# Patient Record
Sex: Female | Born: 1960 | Race: White | Hispanic: No | Marital: Married | Smoking: Never smoker
Health system: Southern US, Community
[De-identification: ages and names within clinical notes are randomized; demographics above are authoritative.]

## PROBLEM LIST (undated history)

## (undated) DIAGNOSIS — E7800 Pure hypercholesterolemia, unspecified: Secondary | ICD-10-CM

## (undated) DIAGNOSIS — E119 Type 2 diabetes mellitus without complications: Secondary | ICD-10-CM

## (undated) DIAGNOSIS — I10 Essential (primary) hypertension: Secondary | ICD-10-CM

## (undated) HISTORY — PX: OTHER SURGICAL HISTORY: SHX169

---

## 1997-10-03 ENCOUNTER — Other Ambulatory Visit: Admission: RE | Admit: 1997-10-03 | Discharge: 1997-10-03 | Payer: Self-pay | Admitting: Obstetrics and Gynecology

## 1998-11-26 ENCOUNTER — Encounter (INDEPENDENT_AMBULATORY_CARE_PROVIDER_SITE_OTHER): Payer: Self-pay | Admitting: Specialist

## 1998-11-26 ENCOUNTER — Other Ambulatory Visit: Admission: RE | Admit: 1998-11-26 | Discharge: 1998-11-26 | Payer: Self-pay | Admitting: Obstetrics and Gynecology

## 2002-03-15 ENCOUNTER — Other Ambulatory Visit: Admission: RE | Admit: 2002-03-15 | Discharge: 2002-03-15 | Payer: Self-pay

## 2002-05-09 ENCOUNTER — Encounter: Admission: RE | Admit: 2002-05-09 | Discharge: 2002-05-09 | Payer: Self-pay

## 2003-04-17 ENCOUNTER — Other Ambulatory Visit: Admission: RE | Admit: 2003-04-17 | Discharge: 2003-04-17 | Payer: Self-pay | Admitting: Obstetrics and Gynecology

## 2003-05-24 ENCOUNTER — Encounter: Admission: RE | Admit: 2003-05-24 | Discharge: 2003-05-24 | Payer: Self-pay | Admitting: Obstetrics and Gynecology

## 2003-06-22 ENCOUNTER — Encounter: Admission: RE | Admit: 2003-06-22 | Discharge: 2003-09-20 | Payer: Self-pay | Admitting: Cardiology

## 2004-06-13 ENCOUNTER — Other Ambulatory Visit: Admission: RE | Admit: 2004-06-13 | Discharge: 2004-06-13 | Payer: Self-pay | Admitting: Obstetrics and Gynecology

## 2004-12-02 ENCOUNTER — Emergency Department (HOSPITAL_COMMUNITY): Admission: EM | Admit: 2004-12-02 | Discharge: 2004-12-02 | Payer: Self-pay | Admitting: Family Medicine

## 2005-07-03 ENCOUNTER — Emergency Department (HOSPITAL_COMMUNITY): Admission: EM | Admit: 2005-07-03 | Discharge: 2005-07-03 | Payer: Self-pay | Admitting: Family Medicine

## 2006-07-20 ENCOUNTER — Emergency Department (HOSPITAL_COMMUNITY): Admission: EM | Admit: 2006-07-20 | Discharge: 2006-07-20 | Payer: Self-pay | Admitting: Emergency Medicine

## 2007-01-17 IMAGING — CR DG ABDOMEN 1V
1 series · 1 of 1 positions shown · non-contrast
Comparison: None.

CLINICAL DATA: Stomach pain.  Diarrhea and nausea.  
 ABDOMEN ? SINGLE VIEW ? 07/03/05:

[view not recorded]
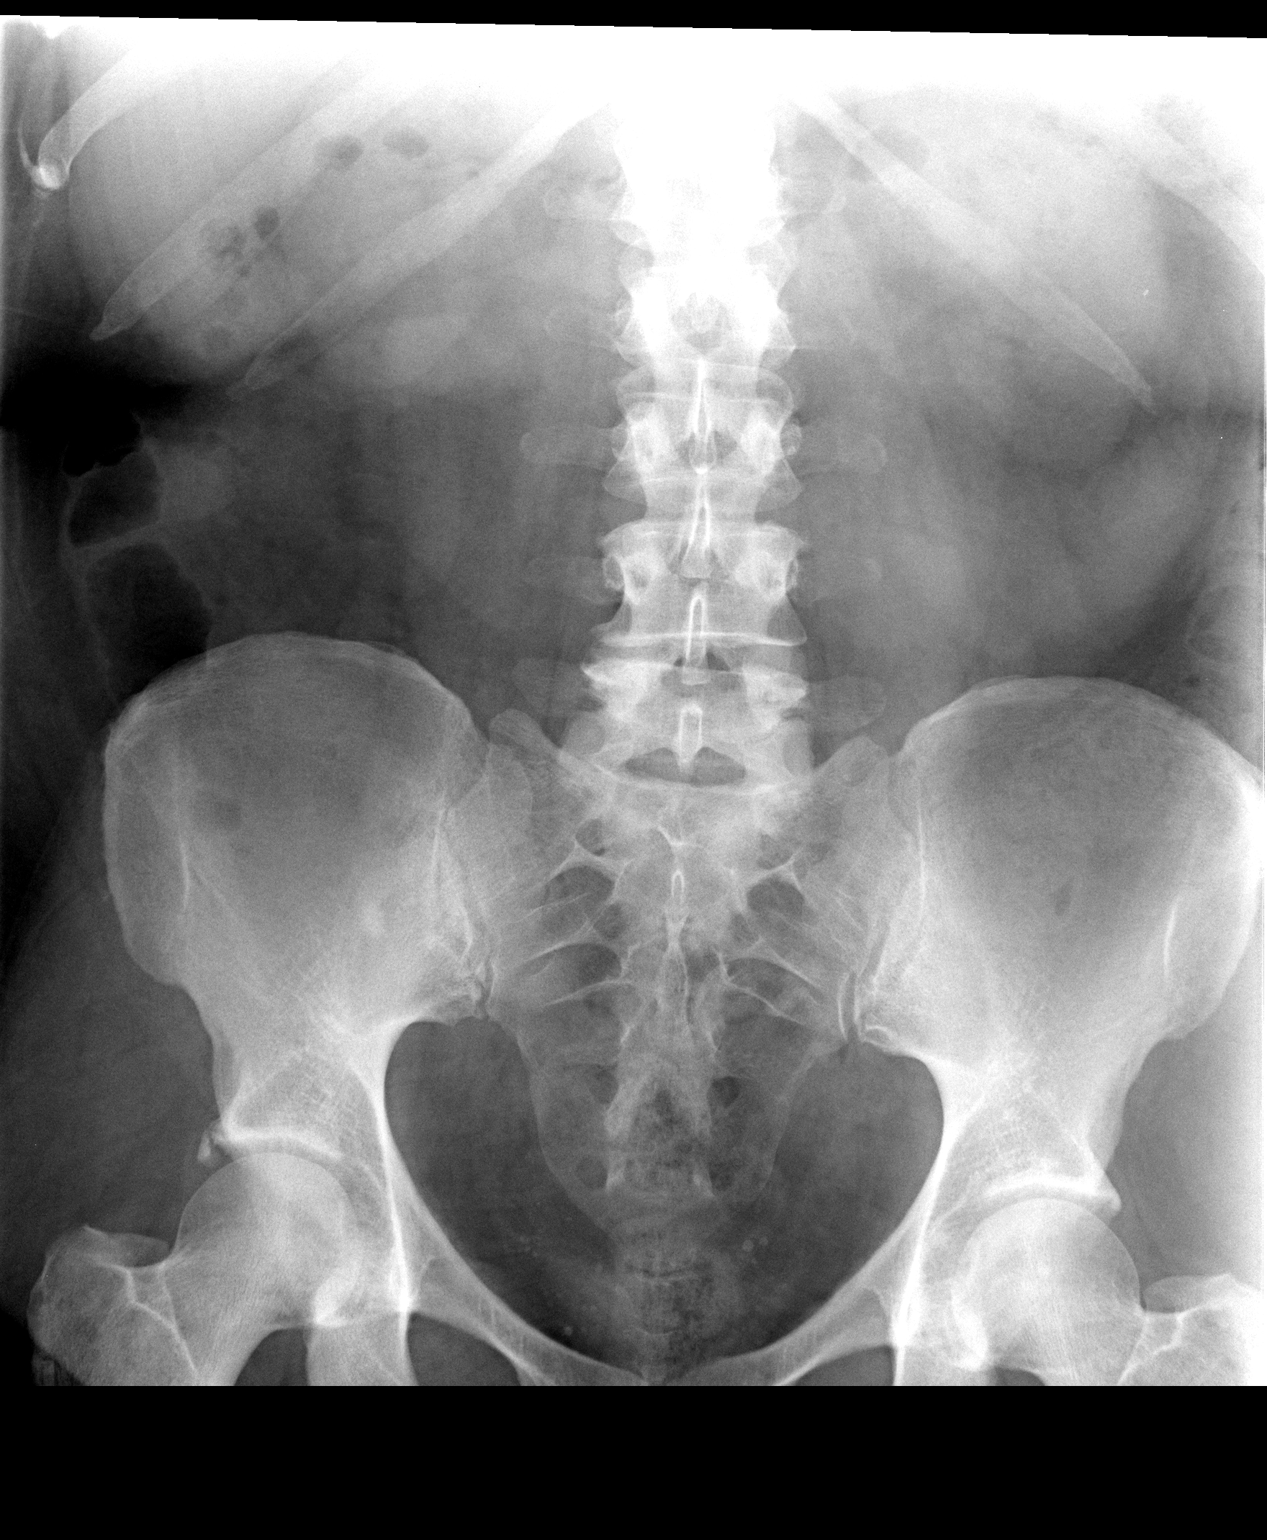

[1 of 1 positions shown; findings below may reference images not displayed]

FINDINGS: No calcific densities of the kidneys and expected course of the ureters.  Unremarkable bowel gas pattern without evidence of dilatation.  Probable phlebolith in the pelvis.  Degenerative changes involve bilateral hips.  Minimal convex left lumbar spinal curvature.
IMPRESSION: No acute process in the abdomen.

## 2010-03-30 ENCOUNTER — Encounter: Payer: Self-pay | Admitting: Obstetrics and Gynecology

## 2013-10-19 ENCOUNTER — Telehealth: Payer: Self-pay

## 2013-10-19 NOTE — Telephone Encounter (Signed)
Pt is out of town and family will let her know we called

## 2013-11-07 NOTE — Telephone Encounter (Signed)
I called pt. She travels quite a bit. She will call back when she is ready to schedule. She has never had a colonoscopy and is not having any problems and no family hx of colon cancer. Letter to PCP.

## 2015-02-06 ENCOUNTER — Encounter (INDEPENDENT_AMBULATORY_CARE_PROVIDER_SITE_OTHER): Payer: Self-pay | Admitting: *Deleted

## 2015-10-31 ENCOUNTER — Telehealth: Payer: Self-pay

## 2015-10-31 NOTE — Telephone Encounter (Signed)
Pt received a letter from DS to be triaged. Please call [phone]

## 2015-11-01 ENCOUNTER — Telehealth: Payer: Self-pay

## 2015-11-01 NOTE — Telephone Encounter (Signed)
See separate triage.  

## 2015-11-06 NOTE — Telephone Encounter (Signed)
Pt called and has rescheduled her appt to 11/29/2015 at 8:30 Am with Dr. Gala Romney.

## 2015-11-06 NOTE — Telephone Encounter (Signed)
Gastroenterology Pre-Procedure Review  Request Date: 11/01/2015 Requesting Physician: Dr. Gerarda Fraction  PATIENT REVIEW QUESTIONS: The patient responded to the following health history questions as indicated:    1. Diabetes Melitis:YES 2. Joint replacements in the past 12 months: no 3. Major health problems in the past 3 months: no 4. Has an artificial valve or MVP: no 5. Has a defibrillator: no 6. Has been advised in past to take antibiotics in advance of a procedure like teeth cleaning: no 7. Family history of colon cancer: no  8. Alcohol Use: wine maybe once or twice a week and occasionally a bourbon 9. History of sleep apnea: no     MEDICATIONS & ALLERGIES:    Patient reports the following regarding taking any blood thinners:   Plavix? no Aspirin? YES Coumadin? no  Patient confirms/reports the following medications:  Current Outpatient Prescriptions  Medication Sig Dispense Refill  . aspirin EC 81 MG tablet Take 81 mg by mouth daily. Takes one tablet every other day    . Linagliptin-Metformin HCl (JENTADUETO) 2.07-998 MG TABS Take by mouth 2 (two) times daily.    Marland Kitchen lisinopril (PRINIVIL,ZESTRIL) 10 MG tablet Take 10 mg by mouth daily.    . simvastatin (ZOCOR) 10 MG tablet Take 10 mg by mouth daily.     No current facility-administered medications for this visit.     Patient confirms/reports the following allergies:  No Known Allergies  No orders of the defined types were placed in this encounter.   AUTHORIZATION INFORMATION Primary Insurance:  ID #:   Group #:  Pre-Cert / Auth required:  Pre-Cert / Auth #:   Secondary Insurance:   ID #:   Group #:  Pre-Cert / Auth required:  Pre-Cert / Auth #:   SCHEDULE INFORMATION: Procedure has been scheduled as follows:  Date:  11/22/2015              Time: 9:00 AM Location: [facility] Short Stay  This Gastroenterology Pre-Precedure Review Form is being routed to the following provider(s): R. Garfield Cornea, MD

## 2015-11-07 NOTE — Telephone Encounter (Signed)
Phenergan 25 mg IV on call due to intermittent ETOH once or twice a week.  No diabetes medication the day of procedure.

## 2015-11-09 ENCOUNTER — Other Ambulatory Visit: Payer: Self-pay

## 2015-11-09 DIAGNOSIS — Z1211 Encounter for screening for malignant neoplasm of colon: Secondary | ICD-10-CM

## 2015-11-09 MED ORDER — NA SULFATE-K SULFATE-MG SULF 17.5-3.13-1.6 GM/177ML PO SOLN: 1.0000 | ORAL | 0 refills | Status: AC

## 2015-11-09 NOTE — Telephone Encounter (Signed)
Orders entered with Phenergan. Rx sent to the pharmacy and instructions mailed to pt.

## 2015-11-15 NOTE — Telephone Encounter (Signed)
Pt's procedure time was moved up to 9:45 AM.  I am mailing new instructions for her and she is aware.

## 2015-12-05 ENCOUNTER — Telehealth: Payer: Self-pay

## 2015-12-05 NOTE — Telephone Encounter (Signed)
PA # [id number] for the colonoscopy. Kaylee Hurst spoke to [place] at [phone].

## 2015-12-05 NOTE — Telephone Encounter (Signed)
I called pt to update meds prior to her colonoscopy tomorrow.  She said there has been no change in her meds since she was triaged.

## 2015-12-06 ENCOUNTER — Ambulatory Visit (HOSPITAL_COMMUNITY)
Admission: RE | Admit: 2015-12-06 | Discharge: 2015-12-06 | Disposition: A | Discharge status: Home / Self Care | Payer: 59 | Source: Ambulatory Visit | Attending: Internal Medicine | Admitting: Internal Medicine

## 2015-12-06 ENCOUNTER — Encounter (HOSPITAL_COMMUNITY): Payer: Self-pay | Admitting: *Deleted

## 2015-12-06 ENCOUNTER — Encounter (HOSPITAL_COMMUNITY)
Admission: RE | Disposition: A | Discharge status: Home / Self Care | Payer: Self-pay | Source: Ambulatory Visit | Attending: Internal Medicine

## 2015-12-06 DIAGNOSIS — E7800 Pure hypercholesterolemia, unspecified: Secondary | ICD-10-CM | POA: Diagnosis not present

## 2015-12-06 DIAGNOSIS — Z7982 Long term (current) use of aspirin: Secondary | ICD-10-CM | POA: Diagnosis not present

## 2015-12-06 DIAGNOSIS — Z1212 Encounter for screening for malignant neoplasm of rectum: Secondary | ICD-10-CM | POA: Diagnosis not present

## 2015-12-06 DIAGNOSIS — E119 Type 2 diabetes mellitus without complications: Secondary | ICD-10-CM | POA: Diagnosis not present

## 2015-12-06 DIAGNOSIS — I10 Essential (primary) hypertension: Secondary | ICD-10-CM | POA: Insufficient documentation

## 2015-12-06 DIAGNOSIS — Z1211 Encounter for screening for malignant neoplasm of colon: Secondary | ICD-10-CM | POA: Diagnosis present

## 2015-12-06 DIAGNOSIS — Z79899 Other long term (current) drug therapy: Secondary | ICD-10-CM | POA: Insufficient documentation

## 2015-12-06 DIAGNOSIS — Z85828 Personal history of other malignant neoplasm of skin: Secondary | ICD-10-CM | POA: Insufficient documentation

## 2015-12-06 HISTORY — DX: Type 2 diabetes mellitus without complications: E11.9

## 2015-12-06 HISTORY — DX: Pure hypercholesterolemia, unspecified: E78.00

## 2015-12-06 HISTORY — PX: COLONOSCOPY: SHX5424

## 2015-12-06 HISTORY — DX: Essential (primary) hypertension: I10

## 2015-12-06 LAB — GLUCOSE, CAPILLARY: Glucose-Capillary: 121 mg/dL — ABNORMAL HIGH (ref 65–99)

## 2015-12-06 SURGERY — COLONOSCOPY
Anesthesia: Moderate Sedation

## 2015-12-06 MED ORDER — MIDAZOLAM HCL 5 MG/5ML IJ SOLN
INTRAMUSCULAR | Status: DC
Start: 2015-12-06 — End: 2015-12-06
  Filled 2015-12-06: qty 10

## 2015-12-06 MED ORDER — MEPERIDINE HCL 100 MG/ML IJ SOLN
INTRAMUSCULAR | Status: DC
Start: 2015-12-06 — End: 2015-12-06
  Filled 2015-12-06: qty 2

## 2015-12-06 MED ORDER — STERILE WATER FOR IRRIGATION IR SOLN
Status: DC | PRN
  Administered 2015-12-06: 10:00:00

## 2015-12-06 MED ORDER — PROMETHAZINE HCL 25 MG/ML IJ SOLN: 25.0000 mg | Freq: Once | INTRAMUSCULAR | Status: DC

## 2015-12-06 MED ORDER — MEPERIDINE HCL 100 MG/ML IJ SOLN
INTRAMUSCULAR | Status: DC | PRN
  Administered 2015-12-06: 50 mg via INTRAVENOUS
  Administered 2015-12-06 (×2): 25 mg via INTRAVENOUS

## 2015-12-06 MED ORDER — SODIUM CHLORIDE 0.9 % IV SOLN
INTRAVENOUS | Status: DC
  Administered 2015-12-06: 10:00:00 via INTRAVENOUS

## 2015-12-06 MED ORDER — ONDANSETRON HCL 4 MG/2ML IJ SOLN
INTRAMUSCULAR | Status: DC
Start: 2015-12-06 — End: 2015-12-06
  Filled 2015-12-06: qty 2

## 2015-12-06 MED ORDER — ONDANSETRON HCL 4 MG/2ML IJ SOLN
INTRAMUSCULAR | Status: DC | PRN
  Administered 2015-12-06: 4 mg via INTRAVENOUS

## 2015-12-06 MED ORDER — MIDAZOLAM HCL 5 MG/5ML IJ SOLN
INTRAMUSCULAR | Status: DC | PRN
  Administered 2015-12-06: 2 mg via INTRAVENOUS
  Administered 2015-12-06: 1 mg via INTRAVENOUS
  Administered 2015-12-06: 2 mg via INTRAVENOUS

## 2015-12-06 NOTE — Discharge Instructions (Signed)

## 2015-12-06 NOTE — H&P (Signed)
$'@LOGO't$ @   Primary Care Physician:  Purvis Kilts, MD Primary Gastroenterologist:  Dr. Gala Romney  Pre-Procedure History & Physical: HPI:  Kaylee Hurst is a 55 y.o. female is here for a screening colonoscopy. No bowel symptoms. No family history colon cancer. No prior colonoscopy.  Past Medical History:  Diagnosis Date  . Diabetes mellitus without complication ([place])   . Hypercholesteremia   . Hypertension     Past Surgical History:  Procedure Laterality Date  . CESAREAN SECTION    . Skin cancer removed from face      X 2 areas    Prior to Admission medications   Medication Sig Start Date End Date Taking? Authorizing Provider  Linagliptin-Metformin HCl (JENTADUETO) 2.07-998 MG TABS Take by mouth 2 (two) times daily.   Yes Historical Provider, MD  lisinopril (PRINIVIL,ZESTRIL) 10 MG tablet Take 10 mg by mouth daily.   Yes Historical Provider, MD  Na Sulfate-K Sulfate-Mg Sulf (SUPREP BOWEL PREP KIT) 17.5-3.13-1.6 GM/180ML SOLN Take 1 kit by mouth as directed. 11/09/15  Yes Daneil Dolin, MD  aspirin EC 81 MG tablet Take 81 mg by mouth daily. Takes one tablet every other day    Historical Provider, MD  simvastatin (ZOCOR) 10 MG tablet Take 10 mg by mouth daily.    Historical Provider, MD    Allergies as of 11/09/2015  . (No Known Allergies)    Family History  Problem Relation Age of Onset  . Colon cancer Neg Hx     Social History   Social History  . Marital status: Married    Spouse name: N/A  . Number of children: N/A  . Years of education: N/A   Occupational History  . Not on file.   Social History Main Topics  . Smoking status: Never Smoker  . Smokeless tobacco: Never Used  . Alcohol use Yes     Comment: Occasion  . Drug use: No  . Sexual activity: Not on file   Other Topics Concern  . Not on file   Social History Narrative  . No narrative on file    Review of Systems: See HPI, otherwise negative ROS  Physical Exam: BP 128/79   Pulse 69    Temp 97.8 F (36.6 C) (Oral)   Resp 17   Ht '5\' 3"'$  (1.6 m)   Wt 188 lb (85.3 kg)   LMP 04/27/2015 (Exact Date)   SpO2 100%   BMI 33.30 kg/m  General:   Alert,  Well-developed, well-nourished, pleasant and cooperative in NAD Head:  Normocephalic and atraumatic. Eyes:  Sclera clear, no icterus.   Conjunctiva pink. Lungs:  Clear throughout to auscultation.   No wheezes, crackles, or rhonchi. No acute distress. Heart:  Regular rate and rhythm; no murmurs, clicks, rubs,  or gallops. Abdomen:  Soft, nontender and nondistended. No masses, hepatosplenomegaly or hernias noted. Normal bowel sounds, without guarding, and without rebound.     Impression/Plan: Kaylee Hurst is now here to undergo a screening colonoscopy.  First ever average risk screening examination.  The risks, benefits, limitations, alternatives and imponderables have been reviewed with the patient. Questions have been answered. All parties are agreeable.    Risks, benefits, limitations, imponderables and alternatives regarding colonoscopy have been reviewed with the patient. Questions have been answered. All parties agreeable.     Notice:  This dictation was prepared with Dragon dictation along with smaller phrase technology. Any transcriptional errors that result from this process are unintentional and may not be corrected  upon review.

## 2015-12-06 NOTE — Op Note (Signed)
[facility] Patient Name: Kaylee Hurst Procedure Date: 12/06/2015 9:44 AM MRN: [id number] Date of Birth: [birth year] Attending MD: Norvel Richards , MD CSN: [id number] Age: 55 Admit Type: Outpatient Procedure:                Colonoscopy Indications:              Screening for colorectal malignant neoplasm Providers:                Norvel Richards, MD, Jeanann Lewandowsky. Gwenlyn Perking RN, RN,                            Janeece Riggers, RN Referring MD:              Medicines:                Midazolam 5 mg IV, Meperidine 100 mg IV,                            Ondansetron 4 mg IV Complications:            No immediate complications. Estimated Blood Loss:     Estimated blood loss: none. Procedure:                Pre-Anesthesia Assessment:                           - Prior to the procedure, a History and Physical                            was performed, and patient medications and                            allergies were reviewed. The patient's tolerance of                            previous anesthesia was also reviewed. The risks                            and benefits of the procedure and the sedation                            options and risks were discussed with the patient.                            All questions were answered, and informed consent                            was obtained. Prior Anticoagulants: The patient has                            taken no previous anticoagulant or antiplatelet                            agents. ASA Grade Assessment: II - A patient with  mild systemic disease. After reviewing the risks                            and benefits, the patient was deemed in                            satisfactory condition to undergo the procedure.                           After obtaining informed consent, the colonoscope                            was passed under direct vision. Throughout the                            procedure, the  patient's blood pressure, pulse, and                            oxygen saturations were monitored continuously. The                            [id number] scope was introduced through                            the anus and advanced to the the cecum, identified                            by appendiceal orifice and ileocecal valve. The                            ileocecal valve, appendiceal orifice, and rectum                            were photographed. The entire colon was well                            visualized. The patient tolerated the procedure                            well. The quality of the bowel preparation was                            adequate. Scope In: 10:04:09 AM Scope Out: 10:17:05 AM Total Procedure Duration: 0 hours 12 minutes 56 seconds  Findings:      The perianal and digital rectal examinations were normal.      The entire examined colon appeared normal on direct and retroflexion       views. Impression:               -                           - The entire examined colon is normal on direct and  retroflexion views.                           - No specimens collected. Moderate Sedation:      Moderate (conscious) sedation was administered by the endoscopy nurse       and supervised by the endoscopist. The following parameters were       monitored: oxygen saturation, heart rate, blood pressure, respiratory       rate, EKG, adequacy of pulmonary ventilation, and response to care.       Total physician intraservice time was 20 minutes. Recommendation:           - Patient has a contact number available for                            emergencies. The signs and symptoms of potential                            delayed complications were discussed with the                            patient. Return to normal activities tomorrow.                            Written discharge instructions were provided to the                             patient.                           - Advance diet as tolerated.                           - Continue present medications.                           - Repeat colonoscopy in 10 years for screening                            purposes.                           - Return to GI office PRN. Procedure Code(s):        --- Professional ---                           [phone], Colonoscopy, flexible; diagnostic, including                            collection of specimen(s) by brushing or washing,                            when performed (separate procedure)                           99152, Moderate sedation services provided by the  same physician or other qualified health care                            professional performing the diagnostic or                            therapeutic service that the sedation supports,                            requiring the presence of an independent trained                            observer to assist in the monitoring of the                            patient's level of consciousness and physiological                            status; initial 15 minutes of intraservice time,                            patient age 42 years or older Diagnosis Code(s):        --- Professional ---                           Z12.11, Encounter for screening for malignant                            neoplasm of colon CPT copyright 2016 [employer]. All rights reserved. The codes documented in this report are preliminary and upon coder review may  be revised to meet current compliance requirements. Cristopher Estimable. Rourk, MD Norvel Richards, MD 12/06/2015 10:25:32 AM This report has been signed electronically. Number of Addenda: 0

## 2015-12-12 ENCOUNTER — Encounter (HOSPITAL_COMMUNITY): Payer: Self-pay | Admitting: Internal Medicine

## 2016-03-13 DIAGNOSIS — Z1389 Encounter for screening for other disorder: Secondary | ICD-10-CM | POA: Diagnosis not present

## 2016-03-13 DIAGNOSIS — E119 Type 2 diabetes mellitus without complications: Secondary | ICD-10-CM | POA: Diagnosis not present

## 2016-03-13 DIAGNOSIS — I10 Essential (primary) hypertension: Secondary | ICD-10-CM | POA: Diagnosis not present

## 2016-03-20 DIAGNOSIS — Z01419 Encounter for gynecological examination (general) (routine) without abnormal findings: Secondary | ICD-10-CM | POA: Diagnosis not present

## 2016-05-01 DIAGNOSIS — L814 Other melanin hyperpigmentation: Secondary | ICD-10-CM | POA: Diagnosis not present

## 2016-05-01 DIAGNOSIS — B078 Other viral warts: Secondary | ICD-10-CM | POA: Diagnosis not present

## 2016-05-01 DIAGNOSIS — D485 Neoplasm of uncertain behavior of skin: Secondary | ICD-10-CM | POA: Diagnosis not present

## 2016-05-01 DIAGNOSIS — L821 Other seborrheic keratosis: Secondary | ICD-10-CM | POA: Diagnosis not present

## 2016-05-01 DIAGNOSIS — D1801 Hemangioma of skin and subcutaneous tissue: Secondary | ICD-10-CM | POA: Diagnosis not present

## 2016-06-30 DIAGNOSIS — K529 Noninfective gastroenteritis and colitis, unspecified: Secondary | ICD-10-CM | POA: Diagnosis not present

## 2016-07-01 DIAGNOSIS — R197 Diarrhea, unspecified: Secondary | ICD-10-CM | POA: Diagnosis not present

## 2016-07-01 DIAGNOSIS — Z1389 Encounter for screening for other disorder: Secondary | ICD-10-CM | POA: Diagnosis not present

## 2016-07-21 DIAGNOSIS — K529 Noninfective gastroenteritis and colitis, unspecified: Secondary | ICD-10-CM | POA: Diagnosis not present

## 2016-07-28 ENCOUNTER — Encounter: Payer: Self-pay | Admitting: Internal Medicine

## 2016-07-30 DIAGNOSIS — E1165 Type 2 diabetes mellitus with hyperglycemia: Secondary | ICD-10-CM | POA: Diagnosis not present

## 2016-08-11 ENCOUNTER — Ambulatory Visit: Payer: 59 | Admitting: Nurse Practitioner

## 2016-10-29 DIAGNOSIS — L570 Actinic keratosis: Secondary | ICD-10-CM | POA: Diagnosis not present

## 2016-10-29 DIAGNOSIS — L821 Other seborrheic keratosis: Secondary | ICD-10-CM | POA: Diagnosis not present

## 2016-10-29 DIAGNOSIS — L814 Other melanin hyperpigmentation: Secondary | ICD-10-CM | POA: Diagnosis not present

## 2016-10-29 DIAGNOSIS — D1801 Hemangioma of skin and subcutaneous tissue: Secondary | ICD-10-CM | POA: Diagnosis not present

## 2016-12-10 DIAGNOSIS — E782 Mixed hyperlipidemia: Secondary | ICD-10-CM | POA: Diagnosis not present

## 2016-12-10 DIAGNOSIS — E119 Type 2 diabetes mellitus without complications: Secondary | ICD-10-CM | POA: Diagnosis not present

## 2016-12-10 DIAGNOSIS — I10 Essential (primary) hypertension: Secondary | ICD-10-CM | POA: Diagnosis not present

## 2017-01-14 DIAGNOSIS — J019 Acute sinusitis, unspecified: Secondary | ICD-10-CM | POA: Diagnosis not present

## 2017-01-30 DIAGNOSIS — N390 Urinary tract infection, site not specified: Secondary | ICD-10-CM | POA: Diagnosis not present

## 2017-03-23 DIAGNOSIS — Z01419 Encounter for gynecological examination (general) (routine) without abnormal findings: Secondary | ICD-10-CM | POA: Diagnosis not present

## 2017-03-23 DIAGNOSIS — Z801 Family history of malignant neoplasm of trachea, bronchus and lung: Secondary | ICD-10-CM | POA: Diagnosis not present

## 2017-03-23 DIAGNOSIS — Z8041 Family history of malignant neoplasm of ovary: Secondary | ICD-10-CM | POA: Diagnosis not present

## 2017-06-08 DIAGNOSIS — L814 Other melanin hyperpigmentation: Secondary | ICD-10-CM | POA: Diagnosis not present

## 2017-06-08 DIAGNOSIS — D1801 Hemangioma of skin and subcutaneous tissue: Secondary | ICD-10-CM | POA: Diagnosis not present

## 2017-06-08 DIAGNOSIS — D225 Melanocytic nevi of trunk: Secondary | ICD-10-CM | POA: Diagnosis not present

## 2017-06-08 DIAGNOSIS — L570 Actinic keratosis: Secondary | ICD-10-CM | POA: Diagnosis not present

## 2017-07-13 DIAGNOSIS — E119 Type 2 diabetes mellitus without complications: Secondary | ICD-10-CM | POA: Diagnosis not present

## 2017-07-13 DIAGNOSIS — Z0000 Encounter for general adult medical examination without abnormal findings: Secondary | ICD-10-CM | POA: Diagnosis not present

## 2017-07-13 DIAGNOSIS — Z6838 Body mass index (BMI) 38.0-38.9, adult: Secondary | ICD-10-CM | POA: Diagnosis not present

## 2017-07-13 DIAGNOSIS — Z1389 Encounter for screening for other disorder: Secondary | ICD-10-CM | POA: Diagnosis not present

## 2017-12-08 DIAGNOSIS — L281 Prurigo nodularis: Secondary | ICD-10-CM | POA: Diagnosis not present

## 2017-12-08 DIAGNOSIS — L814 Other melanin hyperpigmentation: Secondary | ICD-10-CM | POA: Diagnosis not present

## 2017-12-08 DIAGNOSIS — L308 Other specified dermatitis: Secondary | ICD-10-CM | POA: Diagnosis not present

## 2018-02-09 DIAGNOSIS — E119 Type 2 diabetes mellitus without complications: Secondary | ICD-10-CM | POA: Diagnosis not present

## 2018-02-09 DIAGNOSIS — Z6839 Body mass index (BMI) 39.0-39.9, adult: Secondary | ICD-10-CM | POA: Diagnosis not present

## 2018-04-19 DIAGNOSIS — Z6839 Body mass index (BMI) 39.0-39.9, adult: Secondary | ICD-10-CM | POA: Diagnosis not present

## 2018-04-19 DIAGNOSIS — Z01419 Encounter for gynecological examination (general) (routine) without abnormal findings: Secondary | ICD-10-CM | POA: Diagnosis not present

## 2023-07-09 ENCOUNTER — Other Ambulatory Visit: Payer: Self-pay | Admitting: Obstetrics and Gynecology

## 2023-07-09 DIAGNOSIS — R928 Other abnormal and inconclusive findings on diagnostic imaging of breast: Secondary | ICD-10-CM

## 2023-07-23 ENCOUNTER — Ambulatory Visit
Admission: RE | Admit: 2023-07-23 | Discharge: 2023-07-23 | Disposition: A | Discharge status: Home / Self Care | Source: Ambulatory Visit | Attending: Obstetrics and Gynecology | Admitting: Obstetrics and Gynecology

## 2023-07-23 DIAGNOSIS — R928 Other abnormal and inconclusive findings on diagnostic imaging of breast: Secondary | ICD-10-CM
